# Patient Record
Sex: Female | Born: 1975
Health system: Southern US, Community
[De-identification: ages and names within clinical notes are randomized; demographics above are authoritative.]

---

## 2018-06-08 ENCOUNTER — Emergency Department (HOSPITAL_BASED_OUTPATIENT_CLINIC_OR_DEPARTMENT_OTHER)
Admission: EM | Admit: 2018-06-08 | Discharge: 2018-06-08 | Disposition: A | Discharge status: Home / Self Care | Payer: 59 | Attending: Emergency Medicine | Admitting: Emergency Medicine

## 2018-06-08 ENCOUNTER — Emergency Department (HOSPITAL_BASED_OUTPATIENT_CLINIC_OR_DEPARTMENT_OTHER): Payer: 59

## 2018-06-08 ENCOUNTER — Encounter (HOSPITAL_BASED_OUTPATIENT_CLINIC_OR_DEPARTMENT_OTHER): Payer: Self-pay | Admitting: Emergency Medicine

## 2018-06-08 ENCOUNTER — Other Ambulatory Visit: Payer: Self-pay

## 2018-06-08 DIAGNOSIS — R42 Dizziness and giddiness: Secondary | ICD-10-CM | POA: Diagnosis not present

## 2018-06-08 DIAGNOSIS — Y9389 Activity, other specified: Secondary | ICD-10-CM | POA: Diagnosis not present

## 2018-06-08 DIAGNOSIS — W228XXA Striking against or struck by other objects, initial encounter: Secondary | ICD-10-CM | POA: Diagnosis not present

## 2018-06-08 DIAGNOSIS — R110 Nausea: Secondary | ICD-10-CM | POA: Diagnosis not present

## 2018-06-08 DIAGNOSIS — Y929 Unspecified place or not applicable: Secondary | ICD-10-CM | POA: Diagnosis not present

## 2018-06-08 DIAGNOSIS — S0990XA Unspecified injury of head, initial encounter: Secondary | ICD-10-CM | POA: Diagnosis not present

## 2018-06-08 DIAGNOSIS — Y998 Other external cause status: Secondary | ICD-10-CM | POA: Diagnosis not present

## 2018-06-08 MED ORDER — ONDANSETRON 4 MG PO TBDP: 4.0000 mg | ORAL_TABLET | Freq: Three times a day (TID) | ORAL | 0 refills | Status: AC | PRN

## 2018-06-08 MED ORDER — MECLIZINE HCL 25 MG PO TABS: 25.0000 mg | ORAL_TABLET | Freq: Three times a day (TID) | ORAL | 0 refills | Status: AC | PRN

## 2018-06-08 MED ORDER — MECLIZINE HCL 25 MG PO TABS
25.0000 mg | ORAL_TABLET | Freq: Once | ORAL | Status: AC
  Administered 2018-06-08: 25 mg via ORAL
  Filled 2018-06-08: qty 1

## 2018-06-08 MED FILL — ONDANSETRON ODT 4 MG TABLET: 4 | 6 days supply | Qty: 20 | Fill #0

## 2018-06-08 MED FILL — MECLIZINE 25 MG TABLET: 25 | 6 days supply | Qty: 20 | Fill #0

## 2018-06-08 NOTE — ED Notes (Signed)
NAD at this time. Pt is stable and going home.  

## 2018-06-08 NOTE — ED Provider Notes (Signed)
Emergency Department Provider Note   I have reviewed the triage vital signs and the nursing notes.   HISTORY  Chief Complaint Dizziness   HPI Caroline Bell is a 42 y.o. female with PMH of c-section presents to the emergency department for evaluation of vertigo symptoms worse in the past 24 hours.  The patient states that she began having more severe symptoms yesterday with nausea.  Symptoms are worse with movement.  Denies any numbness or weakness.  She was able to call and get an appointment with ENT yesterday who started the patient on prednisone.  She was not given meclizine.  Patient states she was told she had a normal exam and discharged home.  She does note hitting the back of her head while standing up while under a bunk bed.  She struck the back of her head.  Husband at bedside states that she hit the bed "really hard" did not lose consciousness.  She has had some continued scalp soreness in that area with a mild bump noticed in the back of the head which is minimally tender.  No double vision.  She continues to have nausea but no vomiting.  Sometimes continue to be mostly with movement.   History reviewed. No pertinent past medical history.  There are no active problems to display for this patient.   Past Surgical History:  Procedure Laterality Date  . CESAREAN SECTION     Allergies Hydrocodone and Zithromax [azithromycin]  No family history on file.  Social History Social History   Tobacco Use  . Smoking status: Never Smoker  . Smokeless tobacco: Never Used  Substance Use Topics  . Alcohol use: Yes  . Drug use: Never    Review of Systems  Constitutional: No fever/chills Eyes: No visual changes. ENT: No sore throat. Positive vertigo.  Cardiovascular: Denies chest pain. Respiratory: Denies shortness of breath. Gastrointestinal: No abdominal pain.  No nausea, no vomiting.  No diarrhea.  No constipation. Genitourinary: Negative for  dysuria. Musculoskeletal: Negative for back pain. Skin: Negative for rash. Neurological: Negative for headaches, focal weakness or numbness.  10-point ROS otherwise negative.  ____________________________________________   PHYSICAL EXAM:  VITAL SIGNS: ED Triage Vitals [06/08/18 0935]  Enc Vitals Group     BP 111/78     Pulse Rate 85     Resp 16     Temp 98.3 F (36.8 C)     Temp Source Oral     SpO2 98 %     Weight 164 lb (74.4 kg)     Height 5' 4.5" (1.638 m)     Pain Score 0   Constitutional: Alert and oriented. Well appearing and in no acute distress. Eyes: Conjunctivae are normal. PERRL. EOMI. Head: 1 cm hematoma of the left posterior scalp with mild tenderness.  Ears:  Healthy appearing ear canals and TMs bilaterally Nose: No congestion/rhinnorhea. Mouth/Throat: Mucous membranes are moist.  Oropharynx non-erythematous. Neck: No stridor.  Cardiovascular: Normal rate, regular rhythm. Good peripheral circulation. Grossly normal heart sounds.   Respiratory: Normal respiratory effort.  No retractions. Lungs CTAB. Gastrointestinal: Soft and nontender. No distention.  Musculoskeletal: No lower extremity tenderness nor edema. No gross deformities of extremities. Neurologic:  Normal speech and language. No gross focal neurologic deficits are appreciated. Normal CN exam 2-12. Normal finger-to-nose and heel-to-shin exam.  Skin:  Skin is warm, dry and intact. No rash noted.  ____________________________________________  RADIOLOGY  Ct Head Wo Contrast  Result Date: 06/08/2018 CLINICAL DATA:  Dizziness EXAM: CT  HEAD WITHOUT CONTRAST TECHNIQUE: Contiguous axial images were obtained from the base of the skull through the vertex without intravenous contrast. COMPARISON:  None. FINDINGS: Brain: The ventricles are normal in size and configuration. There is no intracranial mass, hemorrhage, extra-axial fluid collection, or midline shift. The brain parenchyma appears unremarkable. No  evident acute infarct. Vascular: No hyperdense vessel. There is no evident vascular calcification. Skull: The bony calvarium a appears intact. Sinuses/Orbits: Visualized paranasal sinuses are clear. Visualized orbits appear symmetric bilaterally. Other: Mastoid air cells are clear. IMPRESSION: Study within normal limits. Electronically Signed   By: Bretta Bang III M.D.   On: 06/08/2018 10:19    ____________________________________________   PROCEDURES  Procedure(s) performed:   Procedures  None ____________________________________________   INITIAL IMPRESSION / ASSESSMENT AND PLAN / ED COURSE  Pertinent labs & imaging results that were available during my care of the patient were reviewed by me and considered in my medical decision making (see chart for details).  Patient presents to the emergency department after head injury with vertigo type symptoms.  She has some associated nausea.  Symptoms are only with movement.  Her neurological exam is normal.  I have no evidence on exam or by history to suspect central vertigo cause.  Patient was started on prednisone but not given meclizine.  Plan for meclizine here.  With continued posterior scalp pain with palpable, small hematoma plan for CT imaging of the head but low suspicion for bleeding or fracture.  CT imaging negative. No acute findings. No indication for blood work at this time. Plan for outpatient PCP follow up and meclizine at home.   At this time, I do not feel there is any life-threatening condition present. I have reviewed and discussed all results (EKG, imaging, lab, urine as appropriate), exam findings with patient. I have reviewed nursing notes and appropriate previous records.  I feel the patient is safe to be discharged home without further emergent workup. Discussed usual and customary return precautions. Patient and family (if present) verbalize understanding and are comfortable with this plan.  Patient will follow-up  with their primary care provider. If they do not have a primary care provider, information for follow-up has been provided to them. All questions have been answered.  ____________________________________________  FINAL CLINICAL IMPRESSION(S) / ED DIAGNOSES  Final diagnoses:  Vertigo  Nausea  Injury of head, initial encounter     MEDICATIONS GIVEN DURING THIS VISIT:  Medications  meclizine (ANTIVERT) tablet 25 mg (25 mg Oral Given 06/08/18 1048)     NEW OUTPATIENT MEDICATIONS STARTED DURING THIS VISIT:  Discharge Medication List as of 06/08/2018 10:48 AM    START taking these medications   Details  meclizine (ANTIVERT) 25 MG tablet Take 1 tablet (25 mg total) by mouth 3 (three) times daily as needed for dizziness., Starting Thu 06/08/2018, Print    ondansetron (ZOFRAN ODT) 4 MG disintegrating tablet Take 1 tablet (4 mg total) by mouth every 8 (eight) hours as needed for nausea or vomiting., Starting Thu 06/08/2018, Print        Note:  This document was prepared using Dragon voice recognition software and may include unintentional dictation errors.  Alona Bene, MD Emergency Medicine    Wendy Mikles, Arlyss Repress, MD 06/08/18 [phone]

## 2018-06-08 NOTE — Discharge Instructions (Signed)
We believe your symptoms were caused by benign vertigo.  Please read through the included information and take any prescribed medication(s).  Follow up with your doctor as listed above.  If you develop any new or worsening symptoms that concern you, including but not limited to persistent dizziness/vertigo, numbness or weakness in your arms or legs, altered mental status, persistent vomiting, or fever greater than 101, please return immediately to the Emergency Department.  

## 2018-06-08 NOTE — ED Triage Notes (Signed)
Dizziness since yesterday. Worse with movement. She hit her head on the bunk bed the day before. Saw her ENT yesterday and given prednisone.

## 2019-11-14 IMAGING — CT CT HEAD W/O CM
3 series · 15 of 47 positions shown, 18 images · non-contrast
Comparison: None.

CLINICAL DATA: Dizziness

EXAM:
CT HEAD WITHOUT CONTRAST
TECHNIQUE: Contiguous axial images were obtained from the base of the skull
through the vertex without intravenous contrast.

[Series 2: head wo · axial · 0.40mm/px · z∈[-179,-54]mm · 9 of 31 slices shown, 12 images]
[im 3/31  brain]
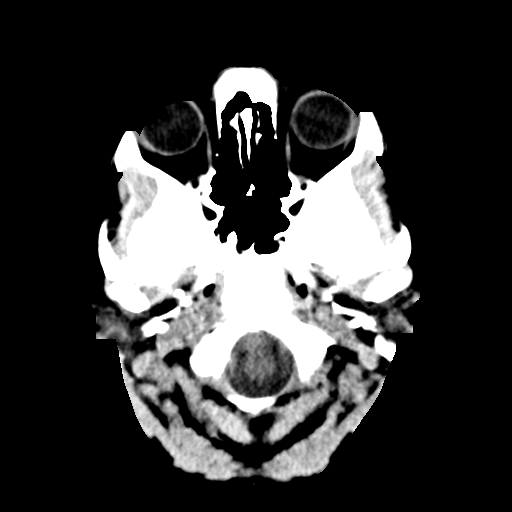
[im 3/31  bone]
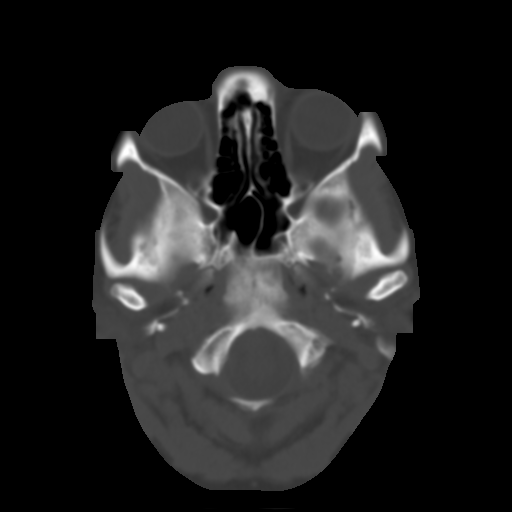
[im 6/31  brain]
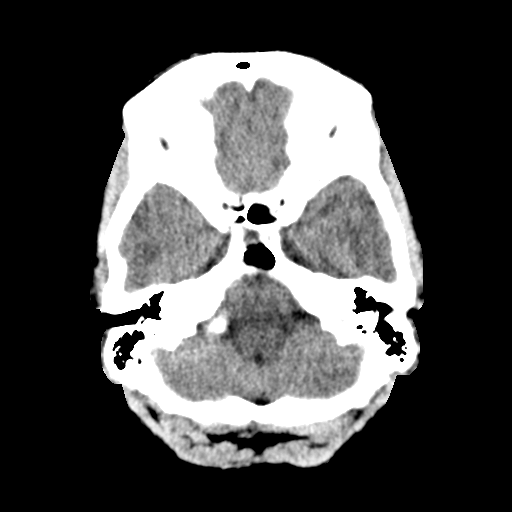
[im 9/31  brain]
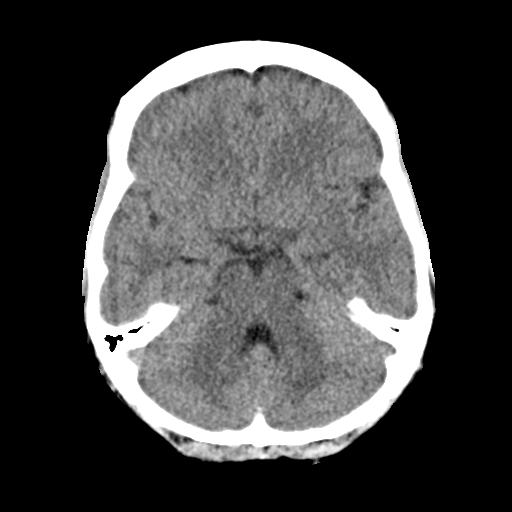
[im 12/31  brain]
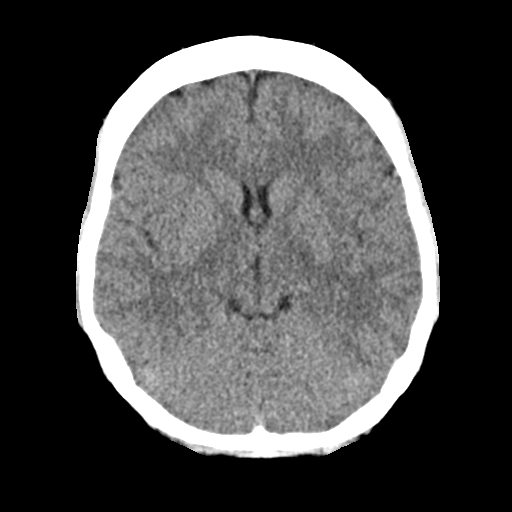
[im 16/31  brain]
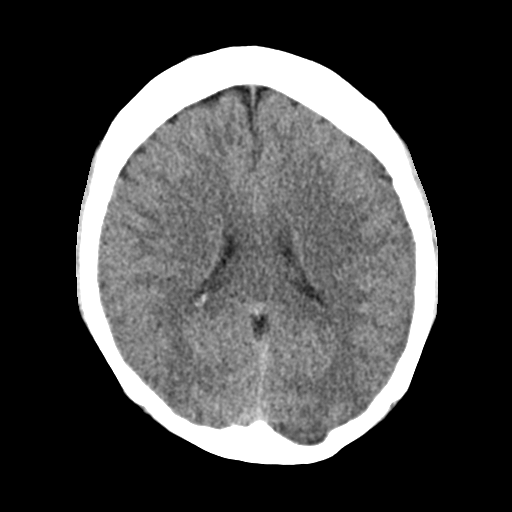
[im 16/31  bone]
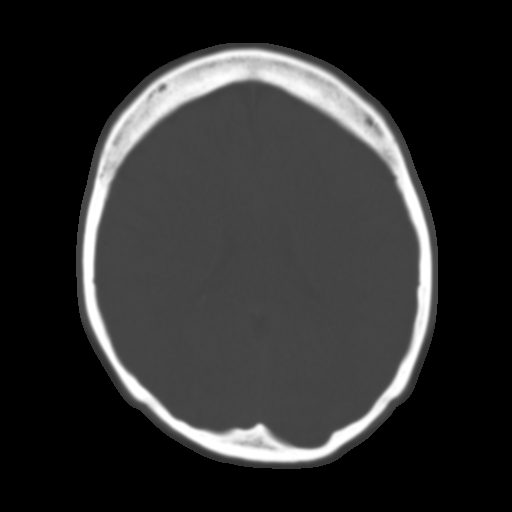
[im 19/31  brain]
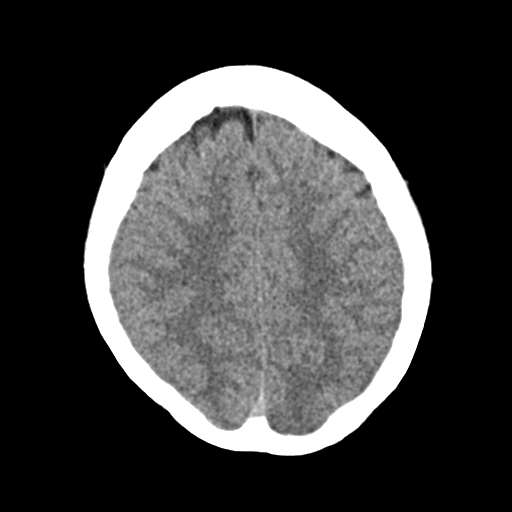
[im 22/31  brain]
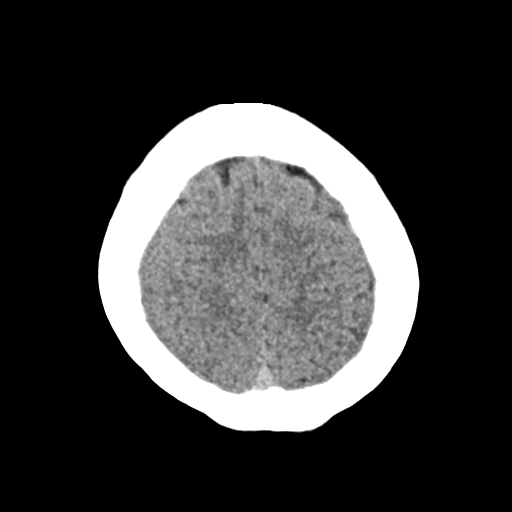
[im 25/31  brain]
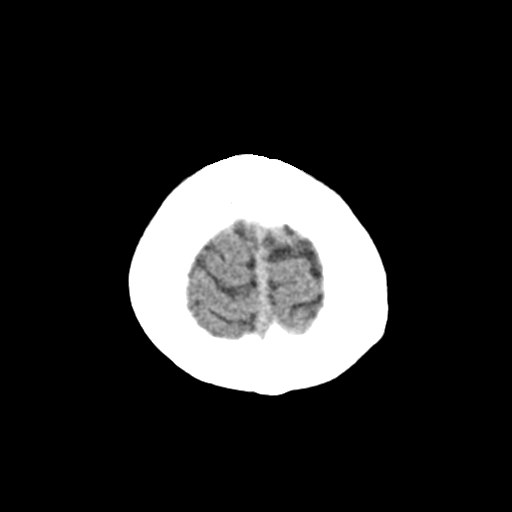
[im 28/31  brain]
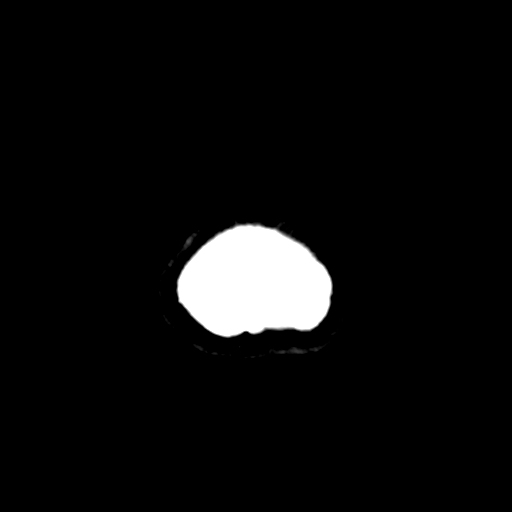
[im 28/31  bone]
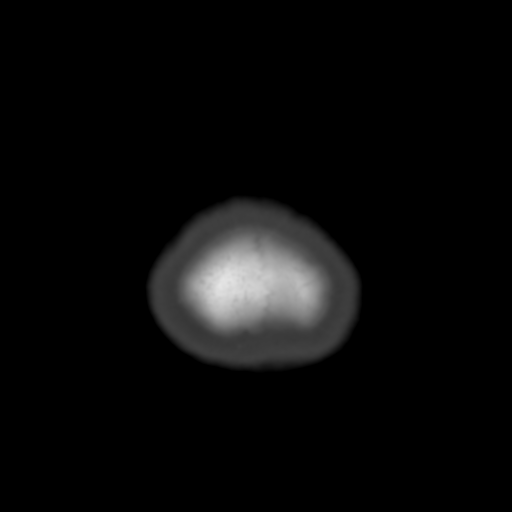

[Series 4: coronal soft · coronal · 0.30mm/px · 3 of 63 slices shown]
[im 21/63  brain]
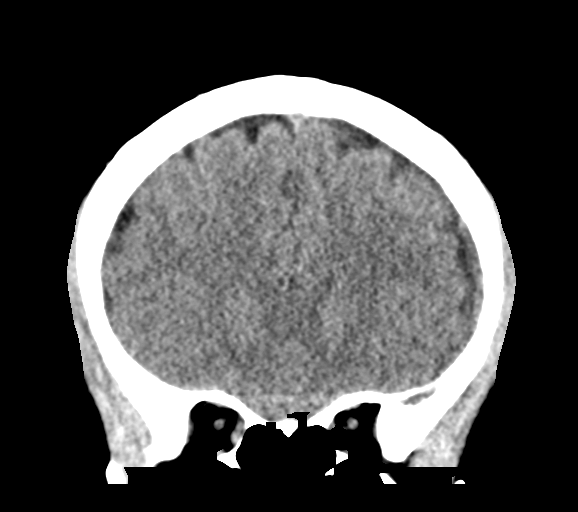
[im 28/63  brain]
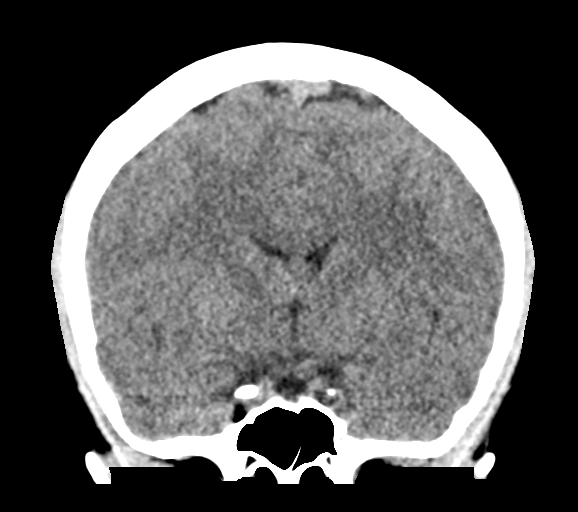
[im 35/63  brain]
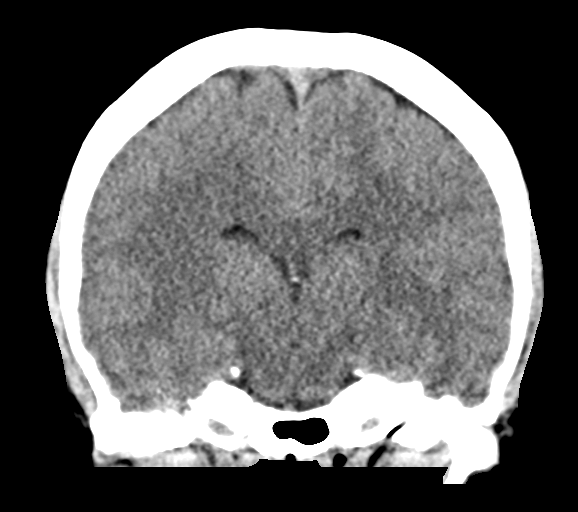

[Series 5: sag soft · sagittal · 0.30mm/px · 3 of 55 slices shown]
[im 19/55  brain]
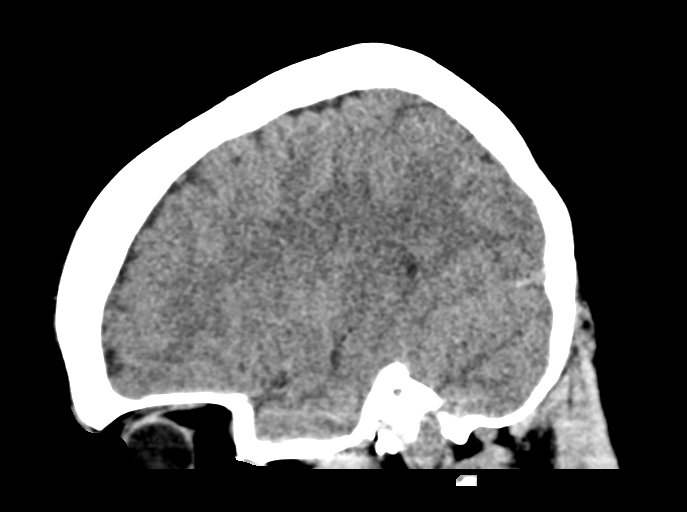
[im 28/55  brain]
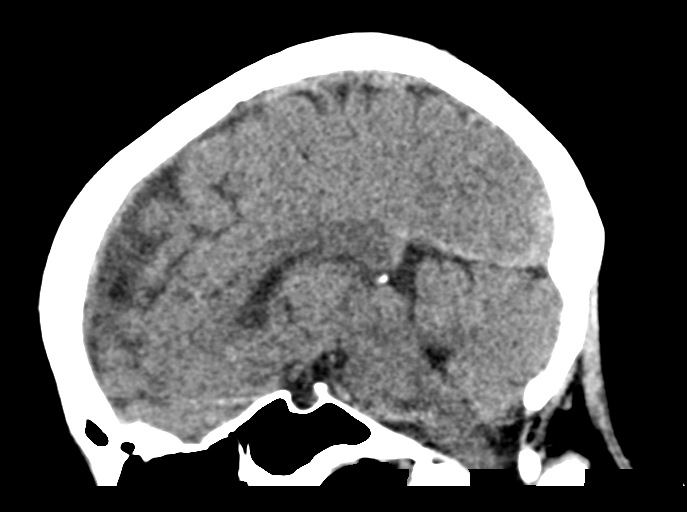
[im 37/55  brain]
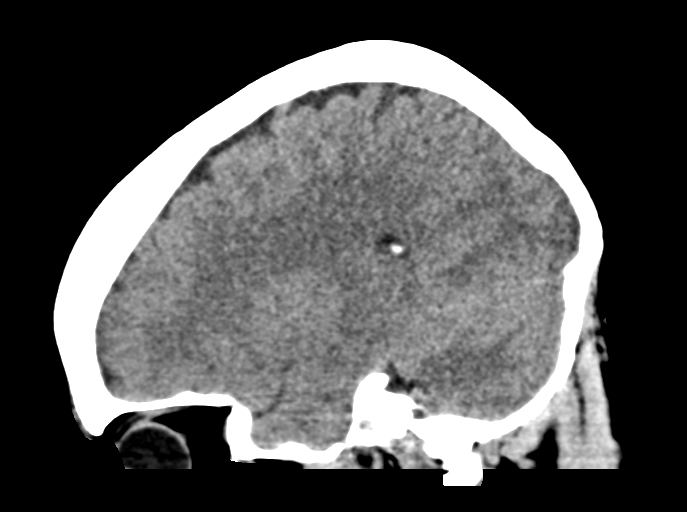

[15 of 47 positions shown; findings below may reference images not displayed]

FINDINGS: Brain: The ventricles are normal in size and configuration. There is
no intracranial mass, hemorrhage, extra-axial fluid collection, or
midline shift. The brain parenchyma appears unremarkable. No evident
acute infarct.

Vascular: No hyperdense vessel. There is no evident vascular
calcification.

Skull: The bony calvarium a appears intact.

Sinuses/Orbits: Visualized paranasal sinuses are clear. Visualized
orbits appear symmetric bilaterally.

Other: Mastoid air cells are clear.
IMPRESSION: Study within normal limits.

## 2024-04-23 NOTE — Progress Notes (Signed)
 HIGH RISK BREAST CANCER CLINIC FOLLOW UP VISIT  Patient: Caroline Bell MRN: [id number] Date: 03/15/2022   DIAGNOSIS: Increased risk of breast cancer  HISTORY OF PRESENT ILLNESS: The patient is a 48 y.o. female from [place], [place], for evaluation for increased risk of breast cancer.   She has not noted any breast abnormality including palpable lumps within either of her breasts, enlarged lymph nodes, nipple discharge or inversion, skin changes or breast pain.  03/15/2022: Caroline Bell presents today in follow up. Since her last visit she reports she is doing well. Denies changes in medical, surgical or family history. She has appreciated no changes in self breast exams. Denies any masses, lumps, suspicious skin changes, nipple abnormalities, enlarged lymph nodes. She is not exercising regularly.  03/28/2023: Caroline Bell presents to clinic today in follow up. Since her last visit she reports she is doing well. She was prescribed tirzepatide by her gynecologist for weight loss.She feels this is helping her. She has lost about 15 pounds in the last 20 weeks. Denies any other changes to medical, surgical or family history. She has appreciated no changes in self breast exams. Denies any masses, lumps, suspicious skin changes, nipple abnormalities, enlarged lymph nodes. She is walking for exercise about 3 times a week.   04/02/2024: Caroline Bell presents to clinic for annual follow up for her high risk for breast cancer. She had an MRI guided biopsy of the right breast in January, benign and concordant results. She had her annual mammogram on 03/29/2024. Since her last visit she reports she is doing well. She had a MRI breast biopsy in January which was benign and concordant. Denies any other changes to medical, surgical or family history. She has appreciated no changes in self breast exams. Denies any masses, lumps, suspicious skin changes, nipple abnormalities, enlarged  lymph nodes. She is continuing to walk for exercise.   PSH:  Past Surgical History:  Procedure Laterality Date  . CESAREAN SECTION     2    PMH: Past Medical History:  Diagnosis Date  . Anxiety   . Depression   . 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 Post traumatic stress disorder 04/22/2018     MEDS:  Current Outpatient Medications:  .  DULoxetine (CYMBALTA) 20 mg capsule, TAKE ONE CAPSULE BY MOUTH DAILY, Disp: 90 capsule, Rfl: 3 .  levonorgestreL (Mirena) 21 mcg/24 hours (8 yrs) 52 mg IUD, 1 each by intrauterine route once., Disp: , Rfl:  .  TIRZEPATIDE SUBQ, Inject under the skin., Disp: , Rfl:      ALLERGIES: Allergies  Allergen Reactions  . Azithromycin GI Upset (intolerance)  . Hydrocodone-Acetaminophen Urticaria / Hives (ALLERGY)     REPRODUCTIVE HISTORY: Onset menses: 13     First pregnancy: 32   Menopause: premenopausal   Number of pregnancy: 2 Hormone: None   SOCIAL HISTORY: Occupation: Chief Financial Officer Marital Status: Married Lives with: Husband and 2 children  FAMILY HISTORY: Breast Cancer: Mother age 10 (IDC Left), 51(IDC Right), 91 Ovarian Cancer: None Other: Mother-Lung age 13; Maternal GM- Lung age 65; Maternal Great GM- Pancreatic age 98; Maternal GF- Prostate age 23; Paternal GF- Prostate age 17; Maternal Great GM- Uterine age 61; Maternal great Aunt- Lung age 46; Maternal 2nd cousin- Cervical; Maternal 2nd cousin #2-pancreatic cancer age 56 Mother had genetic testing performed, BRCA negative  ROS: A complete review of systems was negative  PHYSICAL EXAMINATION:  VITAL SIGNS: Vitals:  04/23/24 1425  BP: 118/72  BP Location: Right arm  Patient Position: Sitting  Pulse: 75  Temp: 98.1 F (36.7 C)  SpO2: 100%  Weight: 66.2 kg (146 lb)     HEENT: Atraumatic, Normocephalic,Extraocular muscles are intact. No palpable adenopathy of the head and neck region. NECK: Supple and  normal thyroid. LUNGS: breathing unlabored HEART: well perfused BREAST: Comprehensive breast exam performed in the seated and supine positions. Breasts are fibrocystic bilaterally. Right breast without palpable or visible masses or nodules, suspicious skin changes, nipple deformity or discharge. Left breast without palpable or visible masses or nodules, suspicious skin changes, nipple deformity or discharge. LYMPHATIC: No palpable bilateral axillary, cervical or supraclavicular lymph nodes. ABDOMEN: Soft, nontender, no hepatosplenomegaly. EXTREMITIES: Without cyanosis, clubbing or edema. NEUROLOGIC: Alert and oriented x 3. Motor is normal and gait is normal. Normal mood and normal affect   IMAGING:  [facility] Breast Screening Tomo Bilat Narrative: BILATERAL SCREENING MAMMOGRAM, 03/29/2024 9:02 AM  INDICATION: Encounter for other screening for malignant neoplasm of breast \ Z12.39 Encounter for other screening for malignant neoplasm of breast \ Z91.89 Other specified personal risk factors, not elsewhere classified   COMPARISON: Multiple priors.  TECHNIQUE: CC and MLO views were done of each breast with digital technique, 3D tomosynthesis, and computer-aided detection.  FINDINGS:   There are no suspicious findings. A letter is sent to the patient with her results.    Impression: No specific mammographic evidence of malignancy.    Breast composition: The breasts are heterogeneously dense, which may obscure small masses.  BI-RADS Category: 1 - Negative. Recommend routine follow-up.  RECOMMENDATION: Follow-up with routine mammography with screening mammography in 1 year.  [place]  law requires that mammography facilities inform patients of their breast density. Higher density breasts may decrease sensitivity of mammography and may be associated with an increased risk of breast cancer. Patients with heterogeneously dense or extremely dense breast tissue should talk with their doctor about  alternative screening strategies. The following website includes further information to assist patients and physicians: https://www.ncacr.org/index.php/advocacy/public-awareness/breast-health. The patient lay summary letter includes their breast density, a link to additional online resources, and a recommendation for those with dense breasts to discuss this factor further with their doctor.        ASSESSMENT: Caroline Bell is a 48 y.o. female who [facility] for a discussion of her risk of breast cancer. Tyrer-Cuzick score 29.5% lifetime risk of breast cancer. She therefore meets NCCN criteria for high risk of breast cancer.   Clinically there is no evidence of recurrent disease on physical exam or by report today. Mammogram demonstrates no mammographic evidence of malignancy. MRI due in November however she would like to space it out further from her annual mammogram. She would like to schedule MRI in January. Breast imaging personally reviewed today.   She continues to meet NCCN criteria for annual breast MRI in addition to annual mammogram.   She has met with genetic counselor, genetic testing was not recommended.   I recommended breast awareness, including self-examinations monthly (having all changes or abnormalities promptly evaluated), clinical breast examinations and annual mammograms.   I discussed the role of lifestyle modifications including healthy diet, regular exercise, limiting alcohol intake, maintenance of a healthy body weight, and whenever possible, avoidance of long-term combined hormone therapy (greater than 3 years) as risk reducing measures for breast cancer.   All questions answered. Patient has been instructed to call if they have any problems or concerns prior  to their follow-up visit.   PLAN:  The patient is in agreement to continue with additional breast screening with annual MRI in addition to annual mammogram. Orders placed today for  mammogram in 1 year and MRI to be scheduled in January at patient's request. Our office will contact her with results via portal.  She is not interested in risk-reducing measures with tamoxifen or surgery at this time. She will contact our office if she changes her mind and wishes to pursue chemoprevention or surgery options.  She will follow up in 1 year about a week after mammogram, sooner with concerns.  Electronically signed by:  Arlyne Earnie Cooley, FNP, 04/23/2024 3:30 PM

## 2024-05-03 ENCOUNTER — Emergency Department (HOSPITAL_BASED_OUTPATIENT_CLINIC_OR_DEPARTMENT_OTHER): Admission: EM | Admit: 2024-05-03 | Discharge: 2024-05-03 | Disposition: A | Discharge status: Home / Self Care

## 2024-05-03 ENCOUNTER — Emergency Department (HOSPITAL_BASED_OUTPATIENT_CLINIC_OR_DEPARTMENT_OTHER)

## 2024-05-03 ENCOUNTER — Other Ambulatory Visit: Payer: Self-pay

## 2024-05-03 ENCOUNTER — Encounter (HOSPITAL_BASED_OUTPATIENT_CLINIC_OR_DEPARTMENT_OTHER): Payer: Self-pay

## 2024-05-03 DIAGNOSIS — K529 Noninfective gastroenteritis and colitis, unspecified: Secondary | ICD-10-CM | POA: Diagnosis not present

## 2024-05-03 DIAGNOSIS — R1032 Left lower quadrant pain: Secondary | ICD-10-CM

## 2024-05-03 LAB — URINALYSIS, ROUTINE W REFLEX MICROSCOPIC
Bilirubin Urine: NEGATIVE
Glucose, UA: NEGATIVE mg/dL
Ketones, ur: NEGATIVE mg/dL
Leukocytes,Ua: NEGATIVE
Nitrite: NEGATIVE
Protein, ur: NEGATIVE mg/dL
Specific Gravity, Urine: 1.02 (ref 1.005–1.030)
pH: 8.5 — ABNORMAL HIGH (ref 5.0–8.0)

## 2024-05-03 LAB — CBC
HCT: 35.6 % — ABNORMAL LOW (ref 36.0–46.0)
Hemoglobin: 12 g/dL (ref 12.0–15.0)
MCH: 30.3 pg (ref 26.0–34.0)
MCHC: 33.7 g/dL (ref 30.0–36.0)
MCV: 89.9 fL (ref 80.0–100.0)
Platelets: 189 K/uL (ref 150–400)
RBC: 3.96 MIL/uL (ref 3.87–5.11)
RDW: 13 % (ref 11.5–15.5)
WBC: 9.4 K/uL (ref 4.0–10.5)
nRBC: 0 % (ref 0.0–0.2)

## 2024-05-03 LAB — COMPREHENSIVE METABOLIC PANEL WITH GFR
ALT: 13 U/L (ref 0–44)
AST: 24 U/L (ref 15–41)
Albumin: 4.4 g/dL (ref 3.5–5.0)
Alkaline Phosphatase: 46 U/L (ref 38–126)
Anion gap: 12 (ref 5–15)
BUN: 13 mg/dL (ref 6–20)
CO2: 23 mmol/L (ref 22–32)
Calcium: 9.2 mg/dL (ref 8.9–10.3)
Chloride: 105 mmol/L (ref 98–111)
Creatinine, Ser: 0.81 mg/dL (ref 0.44–1.00)
GFR, Estimated: >60 mL/min (ref >60)
Glucose, Bld: 85 mg/dL (ref 70–99)
Potassium: 4.5 mmol/L (ref 3.5–5.1)
Sodium: 140 mmol/L (ref 135–145)
Total Bilirubin: 0.6 mg/dL (ref 0.0–1.2)
Total Protein: 7.2 g/dL (ref 6.5–8.1)

## 2024-05-03 LAB — HCG, SERUM, QUALITATIVE: Preg, Serum: NEGATIVE

## 2024-05-03 LAB — URINALYSIS, MICROSCOPIC (REFLEX)

## 2024-05-03 LAB — LIPASE, BLOOD: Lipase: 22 U/L (ref 11–51)

## 2024-05-03 MED ORDER — AMOXICILLIN-POT CLAVULANATE 875-125 MG PO TABS: 1.0000 | ORAL_TABLET | Freq: Two times a day (BID) | ORAL | 0 refills | Status: AC

## 2024-05-03 MED ORDER — ONDANSETRON HCL 4 MG/2ML IJ SOLN
4.0000 mg | Freq: Once | INTRAMUSCULAR | Status: AC
  Administered 2024-05-03: 4 mg via INTRAVENOUS
  Filled 2024-05-03: qty 2

## 2024-05-03 MED ORDER — KETOROLAC TROMETHAMINE 15 MG/ML IJ SOLN
15.0000 mg | Freq: Once | INTRAMUSCULAR | Status: AC
  Administered 2024-05-03: 15 mg via INTRAVENOUS
  Filled 2024-05-03: qty 1

## 2024-05-03 MED ORDER — HYDROMORPHONE HCL 1 MG/ML IJ SOLN
0.5000 mg | Freq: Once | INTRAMUSCULAR | Status: AC
  Administered 2024-05-03: 0.5 mg via INTRAVENOUS
  Filled 2024-05-03: qty 1

## 2024-05-03 MED ORDER — IOHEXOL 300 MG/ML  SOLN
100.0000 mL | Freq: Once | INTRAMUSCULAR | Status: AC | PRN
  Administered 2024-05-03: 100 mL via INTRAVENOUS

## 2024-05-03 MED ORDER — AMOXICILLIN-POT CLAVULANATE 875-125 MG PO TABS
1.0000 | ORAL_TABLET | Freq: Once | ORAL | Status: AC
  Administered 2024-05-03: 1 via ORAL
  Filled 2024-05-03: qty 1

## 2024-05-03 MED ORDER — IBUPROFEN 800 MG PO TABS: 800.0000 mg | ORAL_TABLET | Freq: Three times a day (TID) | ORAL | 0 refills | Status: AC

## 2024-05-03 MED ORDER — SODIUM CHLORIDE 0.9 % IV BOLUS
1000.0000 mL | Freq: Once | INTRAVENOUS | Status: AC
  Administered 2024-05-03: 1000 mL via INTRAVENOUS

## 2024-05-03 NOTE — ED Notes (Signed)
 D/c paperwork reviewed with pt, including prescriptions and follow up care.  All questions and/or concerns addressed at time of d/c.  No further needs expressed. . Pt verbalized understanding, Ambulatory with family to ED exit, NAD.

## 2024-05-03 NOTE — ED Notes (Signed)
 Pt transported to imaging.

## 2024-05-03 NOTE — ED Triage Notes (Signed)
 LLQ abd pain since Monday.   Denies N/V/D, constipation, urinary symptoms

## 2024-05-03 NOTE — ED Provider Notes (Signed)
 [facility] Provider Note   CSN: [id number] Arrival date & time: 05/03/24  1052     Patient presents with: Abdominal Pain   Caroline Bell is a 48 y.o. female who presents to the emergency department with a chief complaint of worsening left lower quadrant abdominal pain since Monday evening.  Patient states that she ate dinner on Monday and then noticed that she had some left-sided abdominal pain from her left rib down to her left hip area.  Patient states that this pain was mild at first and even on the second day she thought it may be musculoskeletal in etiology.  She states however that after Tuesday the pain intensified and now she is in severe pain.  Denies fever, chills, nausea, vomiting, diarrhea, constipation, urinary symptoms.  Denies chest pain or shortness of breath.  Denies previous abdominal surgeries other than C-section, patient states she currently has an IUD and has had one for approximately 12 years.  Patient does state that she is on tirzepatide and she took her last injection on Tuesday morning from a compound pharmacy, patient states that she has been on this medication for quite some time and has not recently increased her dose.  She does appreciate decreased p.o. intake due to pain.  States that she cannot get comfortable, however is most comfortable when laying on her left side.  Denies reduced range of motion of left leg.  Denies trauma/injury.  Patient states that she did take 500 mg of Tylenol last night around 9 PM with no relief.  Past medical history significant for C-section.  Denies specific pelvic pain, abnormal discharge, unexpected pelvic bleeding.    Abdominal Pain      Prior to Admission medications   Medication Sig Start Date End Date Taking? Authorizing Provider  amoxicillin -clavulanate (AUGMENTIN ) 875-125 MG tablet Take 1 tablet by mouth 2 (two) times daily. 05/03/24  Yes Akin Yi F, PA-C  ibuprofen   (ADVIL ) 800 MG tablet Take 1 tablet (800 mg total) by mouth 3 (three) times daily. 05/03/24  Yes Brolin Dambrosia F, PA-C  buPROPion (WELLBUTRIN XL) 300 MG 24 hr tablet Take 300 mg by mouth daily.    [provider]  meclizine  (ANTIVERT ) 25 MG tablet Take 1 tablet (25 mg total) by mouth 3 (three) times daily as needed for dizziness. 06/08/18   Long, Fonda MATSU, MD  ondansetron  (ZOFRAN  ODT) 4 MG disintegrating tablet Take 1 tablet (4 mg total) by mouth every 8 (eight) hours as needed for nausea or vomiting. 06/08/18   Long, Fonda MATSU, MD    Allergies: Hydrocodone and Zithromax [azithromycin]    Review of Systems  Gastrointestinal:  Positive for abdominal pain.    Updated Vital Signs BP 96/67 (BP Location: Right Arm) Comment: Simultaneous filing. User may not have seen previous data.  Pulse 63   Temp 98.1 F (36.7 C) (Oral)   Resp 17   Wt 65.8 kg   SpO2 100%   BMI 24.50 kg/m   Physical Exam Vitals and nursing note reviewed.  Constitutional:      General: She is awake. She is not in acute distress.    Appearance: Normal appearance. She is well-developed. She is not ill-appearing, toxic-appearing or diaphoretic.  HENT:     Head: Normocephalic and atraumatic.  Eyes:     General: No scleral icterus. Cardiovascular:     Rate and Rhythm: Normal rate and regular rhythm.  Pulmonary:     Effort: Pulmonary effort is  normal. No respiratory distress.     Breath sounds: No wheezing, rhonchi or rales.  Abdominal:     General: Abdomen is flat. There is no distension.     Palpations: Abdomen is soft.     Tenderness: There is abdominal tenderness in the left lower quadrant. There is no right CVA tenderness, left CVA tenderness, guarding or rebound.  Musculoskeletal:        General: Normal range of motion.     Right lower leg: No edema.     Left lower leg: No edema.     Comments: No pain with palpation of left hip, left lower extremity range of motion normal without pain  Skin:     General: Skin is warm.     Capillary Refill: Capillary refill takes less than 2 seconds.  Neurological:     General: No focal deficit present.     Mental Status: She is alert and oriented to person, place, and time.  Psychiatric:        Mood and Affect: Mood normal.        Behavior: Behavior normal. Behavior is cooperative.     (all labs ordered are listed, but only abnormal results are displayed) Labs Reviewed  CBC - Abnormal; Notable for the following components:      Result Value   HCT 35.6 (*)    All other components within normal limits  URINALYSIS, ROUTINE W REFLEX MICROSCOPIC - Abnormal; Notable for the following components:   pH 8.5 (*)    Hgb urine dipstick SMALL (*)    All other components within normal limits  URINALYSIS, MICROSCOPIC (REFLEX) - Abnormal; Notable for the following components:   Bacteria, UA RARE (*)    All other components within normal limits  LIPASE, BLOOD  COMPREHENSIVE METABOLIC PANEL WITH GFR  HCG, SERUM, QUALITATIVE    EKG: None  Radiology: CT ABDOMEN PELVIS W CONTRAST Result Date: 05/03/2024 CLINICAL DATA:  Left lower quadrant pain 3 days. EXAM: CT ABDOMEN AND PELVIS WITH CONTRAST TECHNIQUE: Multidetector CT imaging of the abdomen and pelvis was performed using the standard protocol following bolus administration of intravenous contrast. RADIATION DOSE REDUCTION: This exam was performed according to the departmental dose-optimization program which includes automated exposure control, adjustment of the mA and/or kV according to patient size and/or use of iterative reconstruction technique. CONTRAST:  OMNIPAQUE  IOHEXOL  300 MG/ML  SOLN COMPARISON:  None Available. FINDINGS: Lower chest: Heart is normal in size.  Lung bases are clear. Hepatobiliary: Gallbladder is contracted. Liver and biliary tree are normal. Pancreas: Normal. Spleen: Normal. Adrenals/Urinary Tract: Adrenal glands are normal. Kidneys are normal in size without hydronephrosis or  nephrolithiasis. Ureters and bladder are normal. Stomach/Bowel: Stomach and small bowel are normal. Appendix is normal. There is mild wall thickening with significant inflammatory change in the pericolonic fat involving a 5-6 cm of distal descending colon. This may represent focal acute colitis or acute epiploic appendagitis and less likely diverticulitis. There is no significant diverticular disease noted within the colon. There is no free air. Minimal free fluid over the left pericolic gutter. Vascular/Lymphatic: Abdominal aorta is normal in caliber. Remaining vascular structures are unremarkable. No adenopathy. Reproductive: IUD is in adequate position. Uterus and ovaries are otherwise unremarkable. Other: No abdominal wall hernia. Musculoskeletal: No focal abnormality. IMPRESSION: Mild wall thickening with significant inflammatory change in the pericolonic fat involving a 5-6 cm segment of distal descending colon. This may represent focal acute colitis or acute epiploic appendagitis and less likely diverticulitis. No  free air or abscess. Electronically Signed   By: Toribio Agreste M.D.   On: 05/03/2024 13:42     Procedures   Medications Ordered in the ED  HYDROmorphone  (DILAUDID ) injection 0.5 mg (0.5 mg Intravenous Given 05/03/24 1216)  ondansetron  (ZOFRAN ) injection 4 mg (4 mg Intravenous Given 05/03/24 1215)  sodium chloride  0.9 % bolus 1,000 mL (0 mLs Intravenous Stopped 05/03/24 1427)  iohexol  (OMNIPAQUE ) 300 MG/ML solution 100 mL (100 mLs Intravenous Contrast Given 05/03/24 1302)  ketorolac  (TORADOL ) 15 MG/ML injection 15 mg (15 mg Intravenous Given 05/03/24 1426)  amoxicillin -clavulanate (AUGMENTIN ) 875-125 MG per tablet 1 tablet (1 tablet Oral Given 05/03/24 1614)                                    Medical Decision Making Amount and/or Complexity of Data Reviewed Labs: ordered. Radiology: ordered.  Risk Prescription drug management.   Patient presents to the ED for concern of left  lower quadrant abdominal pain, this involves an extensive number of treatment options, and is a complaint that carries with it a high risk of complications and morbidity.  The differential diagnosis includes diverticulitis, colitis, appendicitis, cholecystitis, gastroenteritis, small bowel obstruction, etc.   Co morbidities that complicate the patient evaluation  C-section   Lab Tests:  I Ordered, and personally interpreted labs.  The pertinent results include: Lab workup overall reassuring, no elevated white blood cell count, CBC unremarkable, CMP unremarkable, lipase unremarkable, pregnancy negative, urinalysis not consistent with infection   Imaging Studies ordered:  I ordered imaging studies including CT abdomen pelvis with contrast I independently visualized and interpreted imaging which showed inflammation of the bowel, colitis versus diverticulitis I agree with the radiologist interpretation   Medicines ordered and prescription drug management:  I ordered medication including Dilaudid , Toradol  for pain, Zofran  for nausea, fluids for nausea and vomiting, Augmentin  for possible colitis  Reevaluation of the patient after these medicines showed that the patient improved I have reviewed the patients home medicines and have made adjustments as needed   Test Considered:  Pelvic ultrasound: Declined at this time as patient denies specific pelvic pain, denies sudden onset of pelvic pain, denies abnormal discharge, low clinical suspicion for pelvic inflammatory disease or ovarian torsion at this time based off of history, physical exam, as well as other workup findings   Critical Interventions:  None   Problem List / ED Course:  48 year old female presents emergency department with a chief complaint of left lower quadrant Donnell pain since Monday, no fever or chills but does appreciate nausea and vomiting, no hematemesis, no diarrhea Physical examination point tenderness to left  lower quadrant present however abdomen soft no rebound or guarding, patient denies urinary or pelvic symptoms Lab workup initially overall reassuring, no elevated white blood cell count, CMP unremarkable, lipase unremarkable, urinalysis not consistent with infection, pregnancy negative Obtain CT abdomen pelvis due to point tenderness to left lower quadrant which showed reformation of bowel consistent with colitis versus diverticulitis Will prophylactically treat with Augmentin  to rule out possible bacterial cause, instructed patient on supportive treatment and prescribed outpatient pain control as well Precautions given including strict return precautions regarding worsening symptoms, movement of symptoms to pelvic region as patient may need to return for pelvic ultrasound, patient understanding of this Patient discharged Most likely diagnosis at this time is colitis versus diverticulitis versus other conditions about, low clinical suspicion for ovarian torsion, sepsis, intra-abdominal abscess, kidney  stone, pyelonephritis based off of history, physical exam, and workup   Reevaluation:  After the interventions noted above, I reevaluated the patient and found that they have :improved   Social Determinants of Health:  None   Dispostion:  After consideration of the diagnostic results and the patients response to treatment, I feel that the patent would benefit from discharge and outpatient therapy as described, follow-up with primary care if symptoms worsen or persist or return to the emergency department..       Final diagnoses:  Colitis  LLQ abdominal pain    ED Discharge Orders          Ordered    amoxicillin -clavulanate (AUGMENTIN ) 875-125 MG tablet  2 times daily        05/03/24 1544    ibuprofen  (ADVIL ) 800 MG tablet  3 times daily        05/03/24 1544               Luvena Wentling F, PA-C 05/03/24 1927    Neysa Caron PARAS, DO 05/04/24 [phone]

## 2024-05-03 NOTE — ED Notes (Signed)
Attempted IV access to right hand, tol well but unsuccessful.

## 2024-05-03 NOTE — Discharge Instructions (Addendum)
 It was a pleasure taking care of you today..  Based on your history, physical exam, labs, as well as your imaging I feel you are safe for discharge.  Today your labs were very reassuring however there is mention of possible colitis on your CT abdomen pelvis.  Because of this you have been prescribed an antibiotic medication called Augmentin .  Please take this medication as prescribed and complete the entire course.  I would expect that if your symptoms are due to the colitis that they will improve over the coming days, if your symptoms continue to worsen and you develop fever, chills, severe abdominal pain, excessive nausea/vomiting, urinary symptoms, severe pelvic pain, or other concerning symptom please return to the emergency department.  Please follow-up with your primary care provider and make them aware of all of your findings and results today, if symptoms persist or worsen recommend follow-up within 2 to 3 days.  For pain you have been prescribed a prescription anti-inflammatory medicine, please do not take over-the-counter ibuprofen  with this as they are in the same drug class.  You may also take Tylenol, please keep in mind that the max daily dose of Tylenol is 4000 mg/day with the max single dose being at 1000 mg.

## 2024-05-03 NOTE — ED Notes (Signed)
 IV start attempted x2, unsuccessful.  2nd RN to attempt PIV placement.

## 2024-05-03 NOTE — ED Notes (Signed)
Pt ambulatory to bathroom, steady gait
